# Patient Record
Sex: Female | Born: 1980 | Hispanic: Yes | Marital: Single | Smoking: Never smoker
Health system: Southern US, Community
[De-identification: ages and names within clinical notes are randomized; demographics above are authoritative.]

## PROBLEM LIST (undated history)

## (undated) DIAGNOSIS — E119 Type 2 diabetes mellitus without complications: Secondary | ICD-10-CM

---

## 2017-10-14 ENCOUNTER — Other Ambulatory Visit: Payer: Self-pay

## 2017-10-14 ENCOUNTER — Observation Stay (HOSPITAL_COMMUNITY)
Admission: EM | Admit: 2017-10-14 | Discharge: 2017-10-16 | Disposition: A | Discharge status: Home / Self Care | Payer: Self-pay | Attending: Family Medicine | Admitting: Family Medicine

## 2017-10-14 ENCOUNTER — Encounter (HOSPITAL_COMMUNITY): Payer: Self-pay | Admitting: Emergency Medicine

## 2017-10-14 DIAGNOSIS — E785 Hyperlipidemia, unspecified: Secondary | ICD-10-CM | POA: Insufficient documentation

## 2017-10-14 DIAGNOSIS — Z79899 Other long term (current) drug therapy: Secondary | ICD-10-CM | POA: Insufficient documentation

## 2017-10-14 DIAGNOSIS — E861 Hypovolemia: Secondary | ICD-10-CM

## 2017-10-14 DIAGNOSIS — E860 Dehydration: Secondary | ICD-10-CM | POA: Insufficient documentation

## 2017-10-14 DIAGNOSIS — R739 Hyperglycemia, unspecified: Secondary | ICD-10-CM

## 2017-10-14 DIAGNOSIS — I9589 Other hypotension: Secondary | ICD-10-CM

## 2017-10-14 DIAGNOSIS — R6510 Systemic inflammatory response syndrome (SIRS) of non-infectious origin without acute organ dysfunction: Secondary | ICD-10-CM

## 2017-10-14 DIAGNOSIS — R112 Nausea with vomiting, unspecified: Secondary | ICD-10-CM

## 2017-10-14 DIAGNOSIS — R079 Chest pain, unspecified: Secondary | ICD-10-CM

## 2017-10-14 DIAGNOSIS — E1165 Type 2 diabetes mellitus with hyperglycemia: Principal | ICD-10-CM | POA: Insufficient documentation

## 2017-10-14 DIAGNOSIS — R197 Diarrhea, unspecified: Secondary | ICD-10-CM

## 2017-10-14 DIAGNOSIS — Z7984 Long term (current) use of oral hypoglycemic drugs: Secondary | ICD-10-CM | POA: Insufficient documentation

## 2017-10-14 DIAGNOSIS — R000 Tachycardia, unspecified: Secondary | ICD-10-CM | POA: Insufficient documentation

## 2017-10-14 DIAGNOSIS — E119 Type 2 diabetes mellitus without complications: Secondary | ICD-10-CM

## 2017-10-14 HISTORY — DX: Type 2 diabetes mellitus without complications: E11.9

## 2017-10-14 LAB — COMPREHENSIVE METABOLIC PANEL
ALK PHOS: 100 U/L (ref 38–126)
ALT: 39 U/L (ref 0–44)
AST: 40 U/L (ref 15–41)
Albumin: 3.5 g/dL (ref 3.5–5.0)
Anion gap: 12 (ref 5–15)
BILIRUBIN TOTAL: 0.9 mg/dL (ref 0.3–1.2)
BUN: 30 mg/dL — ABNORMAL HIGH (ref 6–20)
CALCIUM: 8.9 mg/dL (ref 8.9–10.3)
CO2: 19 mmol/L — ABNORMAL LOW (ref 22–32)
Chloride: 100 mmol/L (ref 98–111)
Creatinine, Ser: 1.18 mg/dL — ABNORMAL HIGH (ref 0.44–1.00)
GFR, EST NON AFRICAN AMERICAN: 57 mL/min — AB (ref >60)
GLUCOSE: 526 mg/dL — AB (ref 70–99)
Potassium: 4.4 mmol/L (ref 3.5–5.1)
Sodium: 131 mmol/L — ABNORMAL LOW (ref 135–145)
TOTAL PROTEIN: 7.2 g/dL (ref 6.5–8.1)

## 2017-10-14 LAB — BASIC METABOLIC PANEL
Anion gap: 9 (ref 5–15)
Anion gap: 9 (ref 5–15)
BUN: 19 mg/dL (ref 6–20)
BUN: 17 mg/dL (ref 6–20)
CALCIUM: 7.9 mg/dL — AB (ref 8.9–10.3)
CALCIUM: 8.1 mg/dL — AB (ref 8.9–10.3)
CO2: 19 mmol/L — ABNORMAL LOW (ref 22–32)
CO2: 18 mmol/L — ABNORMAL LOW (ref 22–32)
CREATININE: 0.93 mg/dL (ref 0.44–1.00)
CREATININE: 0.91 mg/dL (ref 0.44–1.00)
Chloride: 107 mmol/L (ref 98–111)
Chloride: 105 mmol/L (ref 98–111)
GFR calc Af Amer: >60 mL/min (ref >60)
GFR calc Af Amer: >60 mL/min (ref >60)
GLUCOSE: 338 mg/dL — AB (ref 70–99)
GLUCOSE: 289 mg/dL — AB (ref 70–99)
Potassium: 3.6 mmol/L (ref 3.5–5.1)
Potassium: 3.5 mmol/L (ref 3.5–5.1)
Sodium: 135 mmol/L (ref 135–145)
Sodium: 132 mmol/L — ABNORMAL LOW (ref 135–145)

## 2017-10-14 LAB — CBC
HCT: 40.8 % (ref 36.0–46.0)
HEMOGLOBIN: 13.1 g/dL (ref 12.0–15.0)
MCH: 28.1 pg (ref 26.0–34.0)
MCHC: 32.1 g/dL (ref 30.0–36.0)
MCV: 87.4 fL (ref 78.0–100.0)
Platelets: 313 10*3/uL (ref 150–400)
RBC: 4.67 MIL/uL (ref 3.87–5.11)
RDW: 13.1 % (ref 11.5–15.5)
WBC: 12.6 10*3/uL — AB (ref 4.0–10.5)

## 2017-10-14 LAB — LIPID PANEL
Cholesterol: 235 mg/dL — ABNORMAL HIGH (ref 0–200)
HDL: 27 mg/dL — ABNORMAL LOW (ref >40)
LDL CALC: 132 mg/dL — AB (ref 0–99)
Total CHOL/HDL Ratio: 8.7 RATIO
Triglycerides: 380 mg/dL — ABNORMAL HIGH (ref <150)
VLDL: 76 mg/dL — ABNORMAL HIGH (ref 0–40)

## 2017-10-14 LAB — TROPONIN I: Troponin I: <0.03 ng/mL (ref <0.03)

## 2017-10-14 LAB — TSH: TSH: 0.555 u[IU]/mL (ref 0.350–4.500)

## 2017-10-14 LAB — LIPASE, BLOOD: Lipase: 42 U/L (ref 11–51)

## 2017-10-14 LAB — GLUCOSE, CAPILLARY
GLUCOSE-CAPILLARY: 287 mg/dL — AB (ref 70–99)
GLUCOSE-CAPILLARY: 258 mg/dL — AB (ref 70–99)

## 2017-10-14 LAB — I-STAT BETA HCG BLOOD, ED (MC, WL, AP ONLY)

## 2017-10-14 LAB — ALBUMIN: Albumin: 2.9 g/dL — ABNORMAL LOW (ref 3.5–5.0)

## 2017-10-14 LAB — HEMOGLOBIN A1C
HEMOGLOBIN A1C: 12.9 % — AB (ref 4.8–5.6)
MEAN PLASMA GLUCOSE: 323.53 mg/dL

## 2017-10-14 LAB — BRAIN NATRIURETIC PEPTIDE: B NATRIURETIC PEPTIDE 5: 58 pg/mL (ref 0.0–100.0)

## 2017-10-14 LAB — CBG MONITORING, ED: Glucose-Capillary: 340 mg/dL — ABNORMAL HIGH (ref 70–99)

## 2017-10-14 LAB — D-DIMER, QUANTITATIVE: D-Dimer, Quant: 0.34 ug/mL-FEU (ref 0.00–0.50)

## 2017-10-14 MED ORDER — SODIUM CHLORIDE 0.9 % IV SOLN
INTRAVENOUS | Status: AC
  Administered 2017-10-14 (×2): via INTRAVENOUS

## 2017-10-14 MED ORDER — INSULIN ASPART 100 UNIT/ML ~~LOC~~ SOLN
0.0000 [IU] | Freq: Three times a day (TID) | SUBCUTANEOUS | Status: DC
  Administered 2017-10-14: 5 [IU] via SUBCUTANEOUS
  Administered 2017-10-15 – 2017-10-16 (×4): 3 [IU] via SUBCUTANEOUS
  Administered 2017-10-16: 5 [IU] via SUBCUTANEOUS

## 2017-10-14 MED ORDER — INSULIN ASPART 100 UNIT/ML ~~LOC~~ SOLN
0.0000 [IU] | Freq: Every day | SUBCUTANEOUS | Status: DC
  Administered 2017-10-14: 3 [IU] via SUBCUTANEOUS
  Administered 2017-10-15: 2 [IU] via SUBCUTANEOUS

## 2017-10-14 MED ORDER — SODIUM CHLORIDE 0.9 % IV BOLUS
1000.0000 mL | Freq: Once | INTRAVENOUS | Status: AC
Start: 2017-10-14 — End: 2017-10-14
  Administered 2017-10-14: 1000 mL via INTRAVENOUS

## 2017-10-14 MED ORDER — ONDANSETRON 4 MG PO TBDP
4.0000 mg | ORAL_TABLET | Freq: Once | ORAL | Status: AC | PRN
  Administered 2017-10-14: 4 mg via ORAL
  Filled 2017-10-14: qty 1

## 2017-10-14 MED ORDER — ENOXAPARIN SODIUM 40 MG/0.4ML ~~LOC~~ SOLN
40.0000 mg | SUBCUTANEOUS | Status: DC
  Administered 2017-10-14 – 2017-10-15 (×2): 40 mg via SUBCUTANEOUS
  Filled 2017-10-14 (×2): qty 0.4

## 2017-10-14 MED ORDER — ONDANSETRON HCL 4 MG/2ML IJ SOLN
4.0000 mg | Freq: Four times a day (QID) | INTRAMUSCULAR | Status: DC | PRN
Start: 2017-10-14 — End: 2017-10-16

## 2017-10-14 MED ORDER — ACETAMINOPHEN 325 MG PO TABS
650.0000 mg | ORAL_TABLET | ORAL | Status: DC | PRN
  Administered 2017-10-14: 650 mg via ORAL
  Filled 2017-10-14: qty 2

## 2017-10-14 MED ORDER — ACETAMINOPHEN 325 MG PO TABS: 650.0000 mg | ORAL_TABLET | ORAL | Status: DC | PRN

## 2017-10-14 MED ORDER — SODIUM CHLORIDE 0.9 % IV BOLUS
2000.0000 mL | Freq: Once | INTRAVENOUS | Status: AC
Start: 2017-10-14 — End: 2017-10-14
  Administered 2017-10-14: 2000 mL via INTRAVENOUS

## 2017-10-14 MED ORDER — CALCIUM CARBONATE ANTACID 500 MG PO CHEW
2.0000 | CHEWABLE_TABLET | Freq: Every day | ORAL | Status: DC
  Administered 2017-10-14 – 2017-10-16 (×3): 400 mg via ORAL
  Filled 2017-10-14 (×3): qty 2

## 2017-10-14 MED ORDER — GI COCKTAIL ~~LOC~~: 30.0000 mL | Freq: Four times a day (QID) | ORAL | Status: DC | PRN

## 2017-10-14 NOTE — Progress Notes (Addendum)
Family Medicine Teaching Service Daily Progress Note Intern Pager: [phone]  Patient name: Alexis Kirby Medical record number: [id number] Date of birth: [birth year] Age: 37 y.o. Gender: female  Primary Care Provider: Patient, No Pcp Per Consultants: None Code Status: Full code  Pt Overview and Major Events to Date:  7/7 Admitted with hyperglycemia, dehydration, N/V/D  Assessment and Plan:  Alexis CohenJulie Alcorta Kirby is a 37 y.o. female presenting with hyperglycemia, nausea, vomiting, diarrhea. PMH is significant for T2DM uncontrolled.  T2DM  Hyperglycemia >500, not DKA: Uncontrolled.  Patient does not have insurance.  States that she has been taking a friend's metformin 800mg  QD.  She has been on insulin in the past, but has been without it for many years.  She states that she will occasionally check her sugars.  Last week was in 300s.  She states that she has not been taking metformin for the last few days.  At 10pm on 7/6 patient started to suddenly experience cramping abdominal pain, nausea, vomiting, and diarrhea.  She notes that she was at a party and had eaten two hours prior.  It continued throughout the night into this AM, when she started to also experience left sided chest pain and shortness of breath. Patient has received 5L NS since admission.  She had temp on 7/7 at 2053 of 101.9, was given tylenol, and patient is afebrile this AM, pulse improved to 105 this AM from admission in 140s.  Hgb A1c 12.9, BS 218 this AM.  Cr 0.84 on 7/8.  Suspect Viral GI component.  Patient notes that she is feeling much better this AM, still having diarrhea, but has decreased.  Denies N/V. - monitor vitals - sSSI and monitor CBGs AC/HS - Zofran for nausea - Will continue to hydrate, mIVF @ 16400mL/hr x 10 hrs - Order C-peptide, A1c - f/u BMP Q4, last today at 1139, then monitor daily - UA pending - repeat CBC in AM  Chest pain (potentially exertional angina): Patient notes stabbing chest pain off an  on for the past few months.  She states that it has worsened with episodes of vomiting.  She notes that it is left sided and worsens with activity.  Radiates from the stomach when cramping and occasionally "my left arm gets tight."  She denies prior cardiac history or hx HTN but admittedly has a very incomplete hx of medical interaction  She does have some reporducible chest pain but states the Chest pain she was talking about is not the one reproducible to palpation of chest wall. D-dimer in ED was negative.  Patient denies current chest pain.  BNP 58, Lipid Panel, TSH 0.555, LDL 132, TG 380.  TG could be elevated in hyperglycemia. Troponin negative x2, no draw last PM for third - Heart score of 2 - incomplete information - f/u echo - Start Simvastatin 20mg  QD, on [employer] $4 List - order CXR  Hyponatremia: Na 131, S/p 3L NS in ED, corrected NA 138.  Na on 7/8 138.  - mIVF @ 100cc/hr x 10 hrs - BMP QD  Needs Outpatient management : Patient does not have a PCP and does not have insurance.  She has been without medicaitons for years for this reason.  She lives in [place], [place] and is here visiting family. - f/u Social Work for assistance with outpatient management, suggest trying FM residency at [facility] in [place] - has had insurance in the past, lost since lost job - will consult care management for assistance with  Medicaid  History of Bartholin Cyst: has history of bartholin cyst that ruptured, required surgery, and caused patient to become septic.  She states that over the last week or so she has started to feel like she is getting another one.  Today she states that she feels as though her cyst has improved and would like to defer exam at this time - consider exam as needed  FEN/GI: Carb modified Prophylaxis: Lovenox    Disposition: home  Subjective:  Patient states that she is feeling much better this AM.  Denies CP, SOB.  Admits to continued diarrhea without blood, but  states it is less frequent.  Denies N/V.    Objective: Temp:  [98.5 F (36.9 C)-101.9 F (38.8 C)] 98.5 F (36.9 C) (07/08 0449) Pulse Rate:  [105-140] 105 (07/08 0449) Resp:  [15-24] 16 (07/08 0449) BP: (96-131)/(48-96) 131/85 (07/08 0449) SpO2:  [93 %-100 %] 100 % (07/08 0449) Physical Exam: General: 37 yo female lying in bed in NAD Cardiovascular: Mildly tachycardic, no m/r/g Respiratory: CTAB, no wheezes, crackles  Abdomen: Soft, non-tender, + bowel sounds Extremities: Trace edema, BLE  Laboratory: Recent Labs  Lab 10/14/17 0420  WBC 12.6*  HGB 13.1  HCT 40.8  PLT 313   Recent Labs  Lab 10/14/17 0420  10/14/17 1923 10/15/17 0331 10/15/17 0709  NA 131*   < > 132* 136 138  K 4.4   < > 3.5 3.7 4.1  CL 100   < > 105 108 109  CO2 19*   < > 18* 21* 25  BUN 30*   < > 17 14 11   CREATININE 1.18*   < > 0.91 0.84 0.78  CALCIUM 8.9   < > 8.1* 7.9* 7.8*  PROT 7.2  --   --   --   --   BILITOT 0.9  --   --   --   --   ALKPHOS 100  --   --   --   --   ALT 39  --   --   --   --   AST 40  --   --   --   --   GLUCOSE 526*   < > 289* 218* 259*   < > = values in this interval not displayed.       Imaging/Diagnostic Tests: No results found.  Rittberger, Solmon Ice, DO 10/15/2017, 9:09 AM PGY-1, [place] FPTS Intern pager: [phone], text pages welcome

## 2017-10-14 NOTE — ED Provider Notes (Signed)
[facility] EMERGENCY [facility] Provider Note   CSN: [id number] Arrival date & time: 10/14/17  0348     History   Chief Complaint Chief Complaint  Patient presents with  . Emesis    HPI Alexis BoerJulie Kirby Alexis Kirby is a 37 y.o. female.  Patient with history of diabetes presents with complaint of N/V/D since 11pm.  She reports some preceding abd 'discomfort' that occurred while she was at a party.  She had multiple episodes of nonbloody, nonbilious emesis at home and watery, nonbloody diarrhea.  Symptoms persisted early this morning.  She decided to come in b/c of sharp pains in chest and some shortness of breath.  Patient denies any fever, cough.  No urinary symptoms.  No skin rashes or leg swelling.  She reports being off Glucophage for about 2 months because she lost insurance.  She has no known sick contacts and reports that nobody else that she knows of, including those at the party, has similar symptoms.  Patient denies risk factors for pulmonary embolism including: unilateral leg swelling, history of DVT/PE/other blood clots, use of exogenous hormones, recent immobilizations, recent surgery, recent travel (>4hr segment), malignancy, hemoptysis.        Past Medical History:  Diagnosis Date  . Diabetes mellitus without complication (HCC)     There are no active problems to display for this patient.   History reviewed. No pertinent surgical history.   OB History   None      Home Medications    Prior to Admission medications   Not on File    Family History No family history on file.  Social History Social History   Tobacco Use  . Smoking status: Never Smoker  . Smokeless tobacco: Never Used  Substance Use Topics  . Alcohol use: Never    Frequency: Never  . Drug use: Never     Allergies   Patient has no known allergies.   Review of Systems Review of Systems  Constitutional: Negative for appetite change and fever.  HENT: Negative for  rhinorrhea and sore throat.   Eyes: Negative for redness.  Respiratory: Positive for shortness of breath. Negative for cough.   Cardiovascular: Positive for chest pain and leg swelling.  Gastrointestinal: Positive for diarrhea, nausea and vomiting. Negative for abdominal pain and blood in stool.       Negative for hematemesis  Genitourinary: Negative for dysuria.  Musculoskeletal: Negative for myalgias.  Skin: Negative for rash.  Neurological: Negative for light-headedness.     Physical Exam Updated Vital Signs BP (!) 152/101   Pulse (!) 137   Temp 97.8 F (36.6 C) (Oral)   Resp 18   Ht 5\' 5"  (1.651 m)   Wt 106.1 kg (234 lb)   LMP 10/08/2017   SpO2 100%   BMI 38.94 kg/m   Physical Exam  Constitutional: She appears well-developed and well-nourished.  HENT:  Head: Normocephalic and atraumatic.  Eyes: Conjunctivae are normal. Right eye exhibits no discharge. Left eye exhibits no discharge.  Neck: Normal range of motion. Neck supple.  Cardiovascular: Regular rhythm and normal heart sounds. Tachycardia present.  Pulmonary/Chest: Effort normal and breath sounds normal. No respiratory distress. She has no wheezes. She has no rales.  Abdominal: Soft. She exhibits no mass. There is tenderness (generalized but more severe over left abdomen). There is no rebound and no guarding.  Neurological: She is alert.  Skin: Skin is warm and dry.  Psychiatric: She has a normal mood and affect.  Nursing  note and vitals reviewed.    ED Treatments / Results  Labs (all labs ordered are listed, but only abnormal results are displayed) Labs Reviewed  COMPREHENSIVE METABOLIC PANEL - Abnormal; Notable for the following components:      Result Value   Sodium 131 (*)    CO2 19 (*)    Glucose, Bld 526 (*)    BUN 30 (*)    Creatinine, Ser 1.18 (*)    GFR calc non Af Amer 57 (*)    All other components within normal limits  CBC - Abnormal; Notable for the following components:   WBC 12.6 (*)      All other components within normal limits  CBG MONITORING, ED - Abnormal; Notable for the following components:   Glucose-Capillary 340 (*)    All other components within normal limits  LIPASE, BLOOD  D-DIMER, QUANTITATIVE (NOT AT [facility])  URINALYSIS, ROUTINE W REFLEX MICROSCOPIC  I-STAT BETA HCG BLOOD, ED ([facility], [facility], [facility] ONLY)  CBG MONITORING, ED    ED ECG REPORT   Date: 10/14/2017  Rate: 125  Rhythm: sinus tachycardia  QRS Axis: right  Intervals: normal  ST/T Wave abnormalities: normal  Conduction Disutrbances:none  Narrative Interpretation:   Old EKG Reviewed: none available  I have personally reviewed the EKG tracing and agree with the computerized printout as noted.  Radiology No results found.  Procedures Procedures (including critical care time)  Medications Ordered in ED Medications  sodium chloride 0.9 % bolus 1,000 mL (1,000 mLs Intravenous New Bag/Given 10/14/17 1411)  ondansetron (ZOFRAN-ODT) disintegrating tablet 4 mg (4 mg Oral Given 10/14/17 0437)  sodium chloride 0.9 % bolus 2,000 mL (0 mLs Intravenous Stopped 10/14/17 0718)     Initial Impression / Assessment and Plan / ED Course  I have reviewed the triage vital signs and the nursing notes.  Pertinent labs & imaging results that were available during my care of the patient were reviewed by me and considered in my medical decision making (see chart for details).     Patient seen and examined. Work-up initiated. Medications ordered.   Vital signs reviewed and are as follows: BP (!) 152/101   Pulse (!) 137   Temp 97.8 F (36.6 C) (Oral)   Resp 18   Ht 5\' 5"  (1.651 m)   Wt 106.1 kg (234 lb)   LMP 10/08/2017   SpO2 100%   BMI 38.94 kg/m   Fluids ordered. Abd is generally tender but worse on left. Labs reassuring to this point. No active CP or SOB. Will hydrate and reassess HR.   8:17 AM patient continues to have frequent episodes of diarrhea.  She has been hydrated 2 L normal saline and continues to  have persistently elevated heart rate 130-140.  Patient is not short of breath.  She has some intermittent aching pains in her left chest.  She also reports swelling of her feet and ankles.  We discussed unlikely possibility of blood clot in the lungs given that the vomiting is the more likely cause, however she would like to be screened for this.  We discussed d-dimer testing and need for CT if this is positive.  Patient wishes to proceed.  This was ordered.  12:44 PM D-dimer ordered several hours ago could not be found by lab. This was re-drawn.   2:34 PM d-dimer was negative.  Hydration continued.  Patient is very orthostatic with standing with blood pressures in the 70s systolic.  Patient agrees to be admitted for continued  hydration and symptom management.  Orthostatic VS for the past 24 hrs:  BP- Lying Pulse- Lying BP- Sitting Pulse- Sitting BP- Standing at 0 minutes Pulse- Standing at 0 minutes  10/14/17 1414 122/78 132 (!) 88/70 138 (!) 76/50 140   Spoke with Family Medicine who will see patient.   Final Clinical Impressions(s) / ED Diagnoses   Final diagnoses:  Dehydration  Nausea vomiting and diarrhea  Hyperglycemia without ketosis  Hypotension due to hypovolemia   Admit.   ED Discharge Orders    None       Renne Crigler, Cordelia Poche 10/14/17 1435    Glynn Octave, MD 10/14/17 [id number]

## 2017-10-14 NOTE — Progress Notes (Signed)
Received page that patient's pulse 130-136, patient asymptomatic.  Labs reviewed, troponin negative, BNP 55.  Continue to give IV fluids.  On telemetry.    Alexis IceBailey J Rittberger, DO 5:46 PM

## 2017-10-14 NOTE — H&P (Addendum)
Family Medicine Teaching [facility] Admission History and Physical Service Pager: [phone]  Patient name: Alexis Kirby Medical record number: [id number] Date of birth: [birth year] Age: 37 y.o. Gender: female  Primary Care Provider: Patient, No Pcp Per Consultants: None Code Status: Full Code  Chief Complaint: Hyperglycemia with dehydration  Assessment and Plan: Alexis Kirby is a 37 y.o. female presenting with hyperglycemia, nausea, vomiting, diarrhea. PMH is significant for T2DM uncontrolled.  T2DM  Hyperglycemia >500, not DKA: Uncontrolled.  Patient does not have insurance.  States that she has been taking a friend's metformin 800mg  QD.  She has been on insulin in the past, but has been without it for many years.  She states that she will occasionally check her sugars.  Last week was in 300s.  She states that she has not been taking metformin for the last few days.  At 10pm on 7/6 patient started to suddenly experience cramping abdominal pain, nausea, vomiting, and diarrhea.  She notes that she was at a party and had eaten two hours prior.  It continued throughout the night into this AM, when she started to also experience left sided chest pain and shortness of breath. Patient has received 3L in ED.  Continues to be hypotensive, tachycardic, and mildly tachypnic, but resting comfortably in bed.  She has remained afebrile.  Oral mucosa dry.   - Admit to telemetry, attending Dr. Pollie Meyer - Diet: carb modified - Condition: Stable, but tachy and tachypnic - monitor vitals - out of bed with assistance as patient is orthostatic - start sSSI and monitor CBGs AC/HS - Zofran for nausea - IVF NS @ 200cc/hr x 10hrs - Order C-peptide, A1c - order BMP Q4  - UA pending - repeat CBC in AM  Chest pain (potentially exertional angina): Patient notes stabbing chest pain off an on for the past few months.  She states that it has worsened with episodes of vomiting.  She notes that it is left  sided and worsens with activity.  Radiates from the stomach when cramping and occasionally "my left arm gets tight."  She denies prior cardiac history or hx HTN but admittedly has a very incomplete hx of medical interaction  She does have some reporducible chest pain but states the Chest pain she was talking about is not the one reproducible to palpation of chest wall. D-dimer in ED was negative.  Patient denies current chest pain.  - Heart score of 2 - incomplete information - order BNP, Lipid Panel, TSH - echo - troponins x3  Hyponatremia: Na 131, S/p 3L NS in ED - start NS @ 200cc/hr x 10 hrs - BMP Q4  Needs Outpatient management : Patient does not have a PCP and does not have insurance.  She has been without medicaitons for years for this reason.  She lives in [place], [place] and is here visiting family. - f/u Social Work for assistance with outpatient management, suggest trying FM residency at [facility] in [place]  History of Bartholin Cyst: has history of bartholin cyst that ruptured, required surgery, and caused patient to become septic.  She states that over the last week or so she has started to feel like she is getting another one. - exam in AM - unable to exam in ED due to patient's family being in the room  FEN/GI: Carb modified Prophylaxis: Lovenox  Disposition: home  History of Present Illness:  Alexis Kirby is a 37 y.o. female presenting with 24hrs of vomiting and diarrhea  that started suddenly.  She states that the symptoms began after eating at a party two hours prior.  She denies fevers, admits to some chills.  She denies knowing of anyone else with similar symptoms at the party.    She also mentions chest pain with tightness in left arm since this started.  She states that the chest pain worsens with abdominal cramping.  Has been feeling lightheaded when standing.  Has had occasional chest pains for years but no medical followup for it.  T2DM diagnosed at 13.   On metformin currently (800?, getting pills from a friend), was on insulin prior but has been off of it for years.  Hasn't checked cbgs for ~1wk (300).  Lives in [place] and is here visiting family.  Does not have a doctor in [place].  Just off of a heavy period, was on ibuprofen for pain.  No thirst complaints, no urinary complaints, , no weight changes, no vaginal complaints.  She denies any previous hospitalizations for diabetes.  Hx of bartholin cyst that ruptured and she went septic.  Notes that she "feels like I'm starting to get another one."   Review Of Systems: Per HPI in addition, no hematochezia, melena, hematemesis.   Patient Active Problem List   Diagnosis Date Noted  . Hyperglycemia 10/14/2017  . T2DM (type 2 diabetes mellitus) (HCC) 10/14/2017  . Chest pain 10/14/2017    Past Medical History: Past Medical History:  Diagnosis Date  . Diabetes mellitus without complication [facility])     Past Surgical History: History reviewed. No pertinent surgical history.  Social History: Social History   Tobacco Use  . Smoking status: Never Smoker  . Smokeless tobacco: Never Used  Substance Use Topics  . Alcohol use: Never    Frequency: Never  . Drug use: Never    Please also refer to relevant sections of EMR.  Family History: Mother has history of DM  Allergies and Medications: No Known Allergies No current facility-administered medications on file prior to encounter.    No current outpatient medications on file prior to encounter.    Objective: BP 125/89 (BP Location: Right Arm)   Pulse (!) 127   Temp 100 F (37.8 C) (Oral)   Resp 20   Ht 5\' 5"  (1.651 m)   Wt 234 lb (106.1 kg)   LMP 10/08/2017   SpO2 99%   BMI 38.94 kg/m   Physical Exam  Constitutional: No distress.  HENT:  Head: Normocephalic and atraumatic.  Oropharynx mildly dry  Eyes: Pupils are equal, round, and reactive to light.  Cardiovascular: Regular rhythm. Exam reveals no gallop and no  friction rub.  No murmur heard. Tachycardic  Respiratory: No respiratory distress. She has no wheezes. She has no rales. She exhibits no tenderness.  Mildly Tachypnic  GI: Soft. Bowel sounds are normal. She exhibits no distension. There is tenderness (LLQ). There is no guarding.  Musculoskeletal: She exhibits edema (Trace BLE).  Neurological: She is alert. No cranial nerve deficit.  Oriented for conversation   Skin: Skin is warm and dry. She is not diaphoretic.  Psychiatric: She has a normal mood and affect.    Labs and Imaging: CBC BMET  Recent Labs  Lab 10/14/17 0420  WBC 12.6*  HGB 13.1  HCT 40.8  PLT 313   Recent Labs  Lab 10/14/17 1623  NA 135  K 3.6  CL 107  CO2 19*  BUN 19  CREATININE 0.93  GLUCOSE 338*  CALCIUM 7.9*  No results found.   Marthenia RollingBland, Geanette Buonocore, DO 10/14/2017, 6:14 PM PGY-1, [place] [place] [facility] FPTS Intern pager: [phone], text pages welcome  FPTS Upper-Level Resident Addendum   I have independently interviewed and examined the patient. I have discussed the above with the original author and agree with their documentation. My edits for correction/addition/clarification are in blue. Please see also any attending notes.    Marthenia RollingScott Jobina Maita, DO PGY-2, [place] 10/14/2017 6:14 PM  FPTS Service pager: [phone] (text pages welcome through [facility])

## 2017-10-14 NOTE — ED Triage Notes (Signed)
Pt reports constant emesis and diarrhea since 2300 last night, pt states pain now beginning to radiate up from abdomen into chest area.

## 2017-10-15 ENCOUNTER — Encounter (HOSPITAL_COMMUNITY): Payer: Self-pay | Admitting: Physician Assistant

## 2017-10-15 ENCOUNTER — Observation Stay (HOSPITAL_BASED_OUTPATIENT_CLINIC_OR_DEPARTMENT_OTHER): Payer: Self-pay

## 2017-10-15 ENCOUNTER — Observation Stay (HOSPITAL_COMMUNITY): Payer: Self-pay

## 2017-10-15 DIAGNOSIS — E782 Mixed hyperlipidemia: Secondary | ICD-10-CM

## 2017-10-15 DIAGNOSIS — R739 Hyperglycemia, unspecified: Secondary | ICD-10-CM

## 2017-10-15 DIAGNOSIS — I9589 Other hypotension: Secondary | ICD-10-CM

## 2017-10-15 DIAGNOSIS — E118 Type 2 diabetes mellitus with unspecified complications: Secondary | ICD-10-CM

## 2017-10-15 DIAGNOSIS — E861 Hypovolemia: Secondary | ICD-10-CM

## 2017-10-15 DIAGNOSIS — R079 Chest pain, unspecified: Secondary | ICD-10-CM

## 2017-10-15 DIAGNOSIS — E860 Dehydration: Secondary | ICD-10-CM

## 2017-10-15 LAB — GLUCOSE, CAPILLARY
GLUCOSE-CAPILLARY: 230 mg/dL — AB (ref 70–99)
GLUCOSE-CAPILLARY: 208 mg/dL — AB (ref 70–99)
Glucose-Capillary: 235 mg/dL — ABNORMAL HIGH (ref 70–99)
Glucose-Capillary: 214 mg/dL — ABNORMAL HIGH (ref 70–99)

## 2017-10-15 LAB — BASIC METABOLIC PANEL
ANION GAP: 7 (ref 5–15)
Anion gap: 4 — ABNORMAL LOW (ref 5–15)
BUN: 14 mg/dL (ref 6–20)
BUN: 11 mg/dL (ref 6–20)
CALCIUM: 7.8 mg/dL — AB (ref 8.9–10.3)
CHLORIDE: 108 mmol/L (ref 98–111)
CHLORIDE: 109 mmol/L (ref 98–111)
CO2: 21 mmol/L — AB (ref 22–32)
CO2: 25 mmol/L (ref 22–32)
CREATININE: 0.78 mg/dL (ref 0.44–1.00)
Calcium: 7.9 mg/dL — ABNORMAL LOW (ref 8.9–10.3)
Creatinine, Ser: 0.84 mg/dL (ref 0.44–1.00)
GFR calc non Af Amer: >60 mL/min (ref >60)
GFR calc non Af Amer: >60 mL/min (ref >60)
GLUCOSE: 259 mg/dL — AB (ref 70–99)
Glucose, Bld: 218 mg/dL — ABNORMAL HIGH (ref 70–99)
POTASSIUM: 3.7 mmol/L (ref 3.5–5.1)
Potassium: 4.1 mmol/L (ref 3.5–5.1)
Sodium: 136 mmol/L (ref 135–145)
Sodium: 138 mmol/L (ref 135–145)

## 2017-10-15 LAB — ECHOCARDIOGRAM COMPLETE
HEIGHTINCHES: 65 in
WEIGHTICAEL: 3744 [oz_av]

## 2017-10-15 LAB — HIV ANTIBODY (ROUTINE TESTING W REFLEX): HIV Screen 4th Generation wRfx: NONREACTIVE

## 2017-10-15 LAB — C-PEPTIDE: C-Peptide: 4.2 ng/mL (ref 1.1–4.4)

## 2017-10-15 MED ORDER — SODIUM CHLORIDE 0.9 % IV SOLN
INTRAVENOUS | Status: AC
  Administered 2017-10-15: 11:00:00 via INTRAVENOUS

## 2017-10-15 MED ORDER — INSULIN ASPART PROT & ASPART (70-30 MIX) 100 UNIT/ML ~~LOC~~ SUSP
3.0000 [IU] | Freq: Every day | SUBCUTANEOUS | Status: DC
Start: 2017-10-15 — End: 2017-10-16
  Administered 2017-10-15: 3 [IU] via SUBCUTANEOUS
  Filled 2017-10-15: qty 10

## 2017-10-15 MED ORDER — SIMVASTATIN 20 MG PO TABS
20.0000 mg | ORAL_TABLET | Freq: Every day | ORAL | Status: DC
  Administered 2017-10-15: 20 mg via ORAL
  Filled 2017-10-15: qty 1

## 2017-10-15 MED ORDER — INSULIN STARTER KIT- SYRINGES (ENGLISH)
1.0000 | Freq: Once | Status: AC
  Administered 2017-10-15: 1
  Filled 2017-10-15: qty 1

## 2017-10-15 MED ORDER — LIVING WELL WITH DIABETES BOOK
Freq: Once | Status: AC
  Administered 2017-10-15: 19:00:00
  Filled 2017-10-15: qty 1

## 2017-10-15 NOTE — Care Management Note (Signed)
Case Management Note  Patient Details  Name: Lamount CohenJulie Alcorta Lookingbill MRN: [id number] Date of Birth: [birth year]  Subjective/Objective:                    Action/Plan:  [place] . No insurance .   Per notes MD wanted NCM to arrange [facility] up [facility] [phone]. Called same advised to refer [facility] [phone].  Called same patient has to call herself to arrange appointment. Information given to patient.   MATCH letter given to patient and explained. Attending will have to write prescriptions.   Patient and visitor voiced understanding to all of above.  Expected Discharge Date:  10/16/17               Expected Discharge Plan:  Home/Self Care  In-House Referral:  Financial Counselor, PCP / [facility]  Discharge planning Services  CM Consult, Medication Assistance, MATCH Program  Post Acute Care Choice:  NA Choice offered to:  Patient  DME Arranged:  N/A DME Agency:  NA  HH Arranged:  NA HH Agency:  NA  Status of Service:  Completed, signed off  If discussed at Long Length of Stay Meetings, dates discussed:    Additional Comments:  Kingsley PlanWile, Latangela Mccomas Marie, RN 10/15/2017, 12:00 PM

## 2017-10-15 NOTE — Progress Notes (Addendum)
Inpatient Diabetes Program Recommendations  AACE/ADA: New Consensus Statement on Inpatient Glycemic Control (2015)  Target Ranges:  Prepandial:   less than 140 mg/dL      Peak postprandial:   less than 180 mg/dL (1-2 hours)      Critically ill patients:  140 - 180 mg/dL   Lab Results  Component Value Date   GLUCAP 235 (H) 10/15/2017   HGBA1C 12.9 (H) 10/14/2017   Review of Glycemic Control  Diabetes history: DM 2 Outpatient Diabetes medications: None Current orders for Inpatient glycemic control: Novolog 0-9 units tid, Novolog 0-5 units qhs  Inpatient Diabetes Program Recommendations:   A1c 12.9% meeting criteria for dual therapy with an oral agent and insulin. Patient does not have insurance, however Metformin along with Sulfonylureas are $4 medications. Will need CM to see about f/u. Will give patient [employer] price list for future and back up plan. Would recommend 70/30 at time of d/c if unable to get medications through clinic.  Please start basal insulin while here, Lantus 18 units Q24 hours. Can dose 70/30 from Lantus dose if patient can tolerate. [facility] has both vial and insulin pen options available.  Will see patient later this am.  Thanks,  Christena DeemShannon Sabel Hornbeck RN, MSN, BC-ADM, [facility] Inpatient Diabetes Coordinator Team Pager [phone] (8a-5p)

## 2017-10-15 NOTE — Progress Notes (Signed)
Inpatient Diabetes Program Recommendations  AACE/ADA: New Consensus Statement on Inpatient Glycemic Control (2015)  Target Ranges:  Prepandial:   less than 140 mg/dL      Peak postprandial:   less than 180 mg/dL (1-2 hours)      Critically ill patients:  140 - 180 mg/dL   Diabetes history: New diagnosis  Spoke with patient at bedside. Patient reports being told she had diabetes at age 37 years old at a health screening but has never followed up since that time. This is a new diagnosis for patient. Discussed A1C results (12.9% this admission) and explained what an A1C is. Patient reports Diabetes runs in her family. Due to her family history patient checked her glucose and it was in the 300's. She has been taking a friends Metformin once a day without side effects.  Discussed basic pathophysiology of DM Type 2, basic home care, importance of checking CBGs and maintaining good CBG control to prevent long-term and short-term complications. Reviewed glucose and A1C goals. Reviewed signs and symptoms of hyperglycemia and hypoglycemia along with treatment for both.   Discussed impact of nutrition, exercise, stress, sickness, and medications on diabetes control. Discussed carbohydrates, carbohydrate goals per day and meal, along with portion sizes. Discussed cutting down portion sizes of tortillas, rice and beans, which is their family staple.   Reviewed Living Well with diabetes booklet and encouraged patient to read through entire book. Informed patient that she maybe prescribed insulin at time of discharge.  Informed patient that Novolin 70/30 can be [facility] for $25 per vial. Provided patient with handout information on Reli-On products and encouraged patient to go to [employer] to get the Reli-On Prime glucometer for $9 and a box of 50 Reli-On test strips for $9.  Asked patient to check her glucose at least 2 times per day (fasting and alternating second check).    Reviewed and  demonstrated how to draw up and administer insulin with vial and syringe and insulin pen. Patient prefers vial and syringe due to cost. Informed patient that RN will be asking him to self-administer insulin to ensure proper technique and ability to administer self insulin shots.  RNs to provide ongoing basic DM education at bedside with this patient and engage patient to actively check blood glucose and administer insulin injections.   Thanks, Christena DeemShannon Nazaria Riesen RN, MSN, BC-ADM, [facility] Inpatient Diabetes Coordinator Team Pager [phone] (8a-5p)

## 2017-10-15 NOTE — Consult Note (Addendum)
Cardiology Consultation:   Patient ID: Alexis Kirby; [id number]; Mar 22, 1981   Admit date: 10/14/2017 Date of Consult: 10/15/2017  Primary Care Provider: Patient, No Pcp Per Primary Cardiologist: new - Dr. Delton See Primary Electrophysiologist:  N/A  Patient Profile:   Alexis Kirby is a 37 y.o. female with a hx of uncontrolled DM II and HLD who is being seen today for the evaluation of chest pain after 4 hours of persistent chest pain at the request of Dr. Pollie Meyer.  History of Present Illness:   Alexis Kirby is 37 year old female with past medical history of uncontrolled diabetes and HLD.  Due to monitor reasons, she has not filled her Glucophage for the past 2 months.  She was at a party yesterday, 4 hours after the party she started having vomiting.  After several hours of vomiting, she started having abdominal discomfort radiating to the left chest.  This only occurs for a few seconds at a time.  The discomfort was intermittent, the last episode was around 1 AM this morning.  Due to intractable vomiting, she was admitted to family medicine service on 10/14/2017.  Initial work-up reviewed revealed blood glucose level 526, her creatinine was also mildly elevated at 1.18, BUN was elevated at 30.  She was tachycardic on first arrival.  She was treated for dehydration and hyperglycemia.  Pregnancy test was negative.  TSH normal.  Hemoglobin A1c was 12.9.  Lipid panel showed elevated cholesterol, low HDL, high LDL and elevated triglyceride.  She denies any recent illicit drug use.  Urine drug test was not checked during this hospitalization.  So far troponin was negative x2.  Cardiology has been consulted for atypical chest pain.   Past Medical History:  Diagnosis Date  . Diabetes mellitus without complication (HCC)     History reviewed. No pertinent surgical history.   Home Medications:  Prior to Admission medications   Not on File    Inpatient Medications: Scheduled Meds: . calcium  carbonate  2 tablet Oral Daily  . enoxaparin (LOVENOX) injection  40 mg Subcutaneous Q24H  . insulin aspart  0-5 Units Subcutaneous QHS  . insulin aspart  0-9 Units Subcutaneous TID WC  . simvastatin  20 mg Oral q1800   Continuous Infusions: . sodium chloride 100 mL/hr at 10/15/17 1121   PRN Meds: acetaminophen, gi cocktail, ondansetron (ZOFRAN) IV  Allergies:   No Known Allergies  Social History:   Social History   Socioeconomic History  . Marital status: Single    Spouse name: Not on file  . Number of children: Not on file  . Years of education: Not on file  . Highest education level: Not on file  Occupational History  . Not on file  Social Needs  . Financial resource strain: Not on file  . Food insecurity:    Worry: Not on file    Inability: Not on file  . Transportation needs:    Medical: Not on file    Non-medical: Not on file  Tobacco Use  . Smoking status: Never Smoker  . Smokeless tobacco: Never Used  Substance and Sexual Activity  . Alcohol use: Never    Frequency: Never  . Drug use: Never  . Sexual activity: Not on file  Lifestyle  . Physical activity:    Days per week: Not on file    Minutes per session: Not on file  . Stress: Not on file  Relationships  . Social connections:    Talks on phone: Not on file  Gets together: Not on file    Attends religious service: Not on file    Active member of club or organization: Not on file    Attends meetings of clubs or organizations: Not on file    Relationship status: Not on file  . Intimate partner violence:    Fear of current or ex partner: Not on file    Emotionally abused: Not on file    Physically abused: Not on file    Forced sexual activity: Not on file  Other Topics Concern  . Not on file  Social History Narrative  . Not on file    Family History:    Family History  Problem Relation Age of Onset  . Diabetes Mellitus II Mother      ROS:  Please see the history of present illness.    All other ROS reviewed and negative.     Physical Exam/Data:   Vitals:   10/14/17 1545 10/14/17 1651 10/14/17 2053 10/15/17 0449  BP: (!) 127/96 125/89 97/82 131/85  Pulse: (!) 126 (!) 127 (!) 114 (!) 105  Resp: (!) 22 20 18 16   Temp:  100 F (37.8 C) (!) 101.9 F (38.8 C) 98.5 F (36.9 C)  TempSrc:  Oral Oral Oral  SpO2: 97% 99% 97% 100%  Weight:      Height:        Intake/Output Summary (Last 24 hours) at 10/15/2017 1352 Last data filed at 10/15/2017 0900 Gross per 24 hour  Intake 2486.14 ml  Output 0 ml  Net 2486.14 ml   Filed Weights   10/14/17 0402  Weight: 234 lb (106.1 kg)   Body mass index is 38.94 kg/m.  General:  Well nourished, well developed, in no acute distress HEENT: normal Lymph: no adenopathy Neck: no JVD Endocrine:  No thryomegaly Vascular: No carotid bruits; FA pulses 2+ bilaterally without bruits  Cardiac:  normal S1, S2; RRR; no murmur  Lungs:  clear to auscultation bilaterally, no wheezing, rhonchi or rales  Abd: soft, nontender, no hepatomegaly  Ext: no edema Musculoskeletal:  No deformities, BUE and BLE strength normal and equal Skin: warm and dry  Neuro:  CNs 2-12 intact, no focal abnormalities noted Psych:  Normal affect   EKG:  The EKG was personally reviewed and demonstrates: Sinus tachycardia, no significant ST-T wave changes. Telemetry:  Telemetry was personally reviewed and demonstrates: Persistent sinus tachycardia, heart rate 100-110  Relevant CV Studies:  Pending echocardiogram  Laboratory Data:  Chemistry Recent Labs  Lab 10/14/17 1923 10/15/17 0331 10/15/17 0709  NA 132* 136 138  K 3.5 3.7 4.1  CL 105 108 109  CO2 18* 21* 25  GLUCOSE 289* 218* 259*  BUN 17 14 11   CREATININE 0.91 0.84 0.78  CALCIUM 8.1* 7.9* 7.8*  GFRNONAA >60 >60 >60  GFRAA >60 >60 >60  ANIONGAP 9 7 4*    Recent Labs  Lab 10/14/17 0420 10/14/17 1923  PROT 7.2  --   ALBUMIN 3.5 2.9*  AST 40  --   ALT 39  --   ALKPHOS 100  --    BILITOT 0.9  --    Hematology Recent Labs  Lab 10/14/17 0420  WBC 12.6*  RBC 4.67  HGB 13.1  HCT 40.8  MCV 87.4  MCH 28.1  MCHC 32.1  RDW 13.1  PLT 313   Cardiac Enzymes Recent Labs  Lab 10/14/17 1623 10/14/17 1923  TROPONINI <0.03 <0.03   No results for input(s): TROPIPOC in the last 168  hours.  BNP Recent Labs  Lab 10/14/17 1623  BNP 58.0    DDimer  Recent Labs  Lab 10/14/17 1240  DDIMER 0.34    Radiology/Studies:  Dg Chest 2 View  Result Date: 10/15/2017 CLINICAL DATA:  Bilateral upper and lower extremity swelling, breathing difficulty now improving. EXAM: CHEST - 2 VIEW COMPARISON:  None in PACs FINDINGS: The lungs are borderline hyperinflated and clear. The heart and pulmonary vascularity are normal. The mediastinum is normal in width. The trachea is midline. There is no pleural effusion. IMPRESSION: There is no active cardiopulmonary disease. Electronically Signed   By: David  Swazil[place] M.D.   On: 10/15/2017 11:17    Assessment and Plan:   1. Atypical chest pain: Chest discomfort only last seconds at the time, appears to be intermittent.  Cardiac risk factors include obesity, hyperlipidemia and a uncontrolled diabetes.  Given the atypical nature of her symptoms, will wait for the results of echocardiogram, if EF is normal, may consider outpatient plain old treadmill test or even monitoring.   2. Dehydration: BUN to creatinine ratio > 20 on arrival.  Likely in the setting of elevated blood glucose level and dehydration, contributing to persistent tachycardia.  Although it may be beneficial to obtain a urine drug test.  Patient adamantly denies any illicit drug use.  3. Fever: Tmax 101.9  4. Uncontrolled diabetes mellitus: Hemoglobin A1c 12.9, blood glucose over 500 on first arrival.  Has not taken any anti-hyperglycemic medication for the past 2 months due to insurance reason.  5. Hyperlipidemia: HDL 27, LDL 132, triglyceride 380, total cholesterol 235.  Need  weight loss and increased activity.  For questions or updates, please contact [facility] Please consult [email or web address] for contact info under Cardiology/STEMI.   Ramond DialSigned, Alexis Kirby, [place]  10/15/2017 1:52 PM    The patient was seen, examined and discussed with Azalee CourseHao Meng, PA-C and I agree with the above.   37 y.o. female with a hx of uncontrolled DM II and HLD who is being seen today for the evaluation of chest pain after 4 hours of persistent vomiting. This is her first episode of chest pain, it happened after 4 hours of sudden onset diarrhea and vomiting, the pain was atypical, sharp, epigastric radiating to the left shoulder. Now resolved. ECG shows just sinus tachycardia otherwise normal. Echo shows normal systolic and diastolic function, no significant valvular abnormalities. She has never smoked and had no significant FH of premature CAD. Her pain is most probably related to vomiting sec to acute GI illness.  I would not proceed with any further ischemic workup. Labs show significant hyperlipidemia - mixed both LDL and TG - I would start rosuvastatin 20 mg po daily a follow up lipids and LFTs in 1 months.   Tobias AlexanderKatarina Malayasia Mirkin, MD 10/15/2017

## 2017-10-15 NOTE — Progress Notes (Signed)
  Echocardiogram 2D Echocardiogram has been performed.  Alexis Kirby 10/15/2017, 10:08 AM

## 2017-10-15 NOTE — Plan of Care (Signed)
  RD consulted for nutrition education regarding diabetes; pt reports she was diagnosed at the age of 37 but has not received any formal education   Lab Results  Component Value Date   HGBA1C 12.9 (H) 10/14/2017    RD provided "Carbohydrate Counting for People with Diabetes" handout from the Academy of Nutrition and Dietetics. Discussed different food groups and their effects on blood sugar, emphasizing carbohydrate-containing foods. Provided list of carbohydrates and recommended serving sizes of common foods.  Discussed importance of controlled and consistent carbohydrate intake throughout the day. Provided examples of ways to balance meals/snacks and encouraged intake of high-fiber, whole grain complex carbohydrates. Teach back method used.  Pt very receptive. Expect good compliance.  Body mass index is 38.94 kg/m. Pt meets criteria for obesity unspecified based on current BMI.  Current diet order is Heart Healthy/Carb Mod, patient is consuming approximately 75-100% of meals at this time. Labs and medications reviewed. No further nutrition interventions warranted at this time. RD contact information provided. If additional nutrition issues arise, please re-consult RD.  Romelle Starcherate Babacar Haycraft MS, RD, LDN, CNSC [phone] Pager  [phone] Weekend/On-Call Pager

## 2017-10-16 LAB — GLUCOSE, CAPILLARY
Glucose-Capillary: 237 mg/dL — ABNORMAL HIGH (ref 70–99)
Glucose-Capillary: 292 mg/dL — ABNORMAL HIGH (ref 70–99)

## 2017-10-16 LAB — BASIC METABOLIC PANEL
ANION GAP: 7 (ref 5–15)
BUN: 10 mg/dL (ref 6–20)
CHLORIDE: 104 mmol/L (ref 98–111)
CO2: 24 mmol/L (ref 22–32)
Calcium: 8 mg/dL — ABNORMAL LOW (ref 8.9–10.3)
Creatinine, Ser: 0.79 mg/dL (ref 0.44–1.00)
GFR calc Af Amer: >60 mL/min (ref >60)
Glucose, Bld: 259 mg/dL — ABNORMAL HIGH (ref 70–99)
POTASSIUM: 3.8 mmol/L (ref 3.5–5.1)
SODIUM: 135 mmol/L (ref 135–145)

## 2017-10-16 LAB — CBC
HCT: 32.6 % — ABNORMAL LOW (ref 36.0–46.0)
Hemoglobin: 10.2 g/dL — ABNORMAL LOW (ref 12.0–15.0)
MCH: 27.6 pg (ref 26.0–34.0)
MCHC: 31.3 g/dL (ref 30.0–36.0)
MCV: 88.3 fL (ref 78.0–100.0)
Platelets: 228 10*3/uL (ref 150–400)
RBC: 3.69 MIL/uL — ABNORMAL LOW (ref 3.87–5.11)
RDW: 13.4 % (ref 11.5–15.5)
WBC: 5.7 10*3/uL (ref 4.0–10.5)

## 2017-10-16 MED ORDER — "INSULIN SYRINGE-NEEDLE U-100 30G X 5/16"" 0.3 ML MISC": 1.0000 | Freq: Two times a day (BID) | 12 refills | Status: AC

## 2017-10-16 MED ORDER — SHARPS CONTAINER MISC: 1.0000 | 1 refills | Status: AC | PRN

## 2017-10-16 MED ORDER — SIMVASTATIN 20 MG PO TABS: 20.0000 mg | ORAL_TABLET | Freq: Every day | ORAL | 0 refills | Status: AC

## 2017-10-16 MED ORDER — INSULIN ASPART PROT & ASPART (70-30 MIX) 100 UNIT/ML ~~LOC~~ SUSP: 5.0000 [IU] | Freq: Two times a day (BID) | SUBCUTANEOUS | 0 refills | Status: AC

## 2017-10-16 MED ORDER — GLUCOSE BLOOD VI STRP: ORAL_STRIP | 12 refills | Status: AC

## 2017-10-16 MED ORDER — METFORMIN HCL 1000 MG PO TABS: 1000.0000 mg | ORAL_TABLET | Freq: Every day | ORAL | 0 refills | Status: AC

## 2017-10-16 MED ORDER — RELION LANCING DEVICE KIT: 1.0000 | PACK | Freq: Two times a day (BID) | 1 refills | Status: AC

## 2017-10-16 MED ORDER — GUAIFENESIN ER 600 MG PO TB12: 600.0000 mg | ORAL_TABLET | Freq: Two times a day (BID) | ORAL | Status: DC | PRN

## 2017-10-16 MED ORDER — RELION LANCETS ULTRA-THIN 30G MISC: 1.0000 [IU] | Freq: Two times a day (BID) | 12 refills | Status: AC

## 2017-10-16 MED ORDER — RELION CONFIRM GLUCOSE MONITOR W/DEVICE KIT: 1.0000 [IU] | PACK | Freq: Two times a day (BID) | 0 refills | Status: AC

## 2017-10-16 MED ORDER — INSULIN ASPART PROT & ASPART (70-30 MIX) 100 UNIT/ML ~~LOC~~ SUSP
5.0000 [IU] | Freq: Two times a day (BID) | SUBCUTANEOUS | Status: DC
  Filled 2017-10-16: qty 10

## 2017-10-16 NOTE — Progress Notes (Signed)
Discharge instructions (including medications) discussed with and copy provided to patient/caregiver 

## 2017-10-16 NOTE — Progress Notes (Signed)
Patient seen an examined for complaint of Bartholin cyst present x1.5 weeks per patient report. She denies any use of warm compresses and states that it feels improved from a few days ago.  Nurse Olegario MessierKathy present for exam.    Patient has a 1.5 cm erythematous cyst at right posterior vestibule of vagina, not draining. Mildly tender to palpation.    Recommend patient use warm compresses on the affected area every 2 hrs x 1 week and if not improved, to call PCP for examination.  Solmon IceBailey J Rittberger, DO 2:14 PM

## 2017-10-16 NOTE — Progress Notes (Signed)
Family Medicine Teaching Service Daily Progress Note Intern Pager: [phone]  Patient name: Alexis Kirby Medical record number: [id number] Date of birth: 11-03-1980 Age: 37 y.o. Gender: female  Primary Care Provider: Patient, No Pcp Per Consultants: None Code Status: Full code  Pt Overview and Major Events to Date:  7/7 Admitted with hyperglycemia, dehydration, N/V/D  Assessment and Plan:  Alexis Kirby is a 37 y.o. female presenting with hyperglycemia, nausea, vomiting, diarrhea. PMH is significant for T2DM uncontrolled.  T2DM  Hyperglycemia >500, not DKA: Uncontrolled.  Patient does not have insurance.  States that she has been taking a friend's metformin 800mg  QD.  She has been on insulin in the past, but has been without it for many years.  She states that she will occasionally check her sugars.  Last week was in 300s.  She states that she has not been taking metformin for the last few days.  At 10pm on 7/6 patient started to suddenly experience cramping abdominal pain, nausea, vomiting, and diarrhea.  She notes that she was at a party and had eaten two hours prior.  It continued throughout the night into this AM, when she started to also experience left sided chest pain and shortness of breath. Patient has received 5L NS since admission.  Patient has remained afebrile overnight, pulse 100 this AM.  Hgb A1c 12.9, BS 259 this AM.  Given 3 units 70/30 insulin on 7/8.  C-peptide 4.2, Cr 0.79 on 7/9.  Suspect Viral GI component.  Patient states that she feels "great" this AM, still having some diarrhea, but improving.  Denies N/V.  - monitor vitals - sSSI and monitor CBGs AC/HS - Start insulin 70/30 5u BID  - Start Metformin 1000 QD  - Zofran for nausea - not currently on IVF   Chest pain (potentially exertional angina): Patient notes stabbing chest pain off an on for the past few months.  She states that it has worsened with episodes of vomiting.  She notes that it is left sided  and worsens with activity.  Radiates from the stomach when cramping and occasionally "my left arm gets tight."  She denies prior cardiac history or hx HTN but admittedly has a very incomplete hx of medical interaction  She does have some reporducible chest pain but states the Chest pain she was talking about is not the one reproducible to palpation of chest wall. D-dimer in ED was negative.  Patient denies current chest pain.  BNP 58, Lipid Panel, TSH 0.555, LDL 132, TG 380.  TG could be elevated in hyperglycemia. Troponin negative x2, no draw last PM for third.  CXR on 7/8 showed no active cardiopulm disease.  Echo on 7/8 EF 55-60% with normal LV function and no valvular disease. Cardiology does not recommend further ischemic workup.  BP 156/102 R arm this AM, recheck on L arm 125/75.  Repeat of R arm in 20 minutes 137/88. - cardiology consult, appreciate recommendations - Heart score of 2 - incomplete information - Start Simvastatin 20mg  QD, on [employer] [facility] List, f/u lipid panel and LFTs in 1 month   Needs Outpatient management : Patient does not have a PCP and does not have insurance.  She has been without medicaitons for years for this reason.  [place] in [place], [place] and is here visiting [place].  Social work has given patient information on [facility] letter and Medication assistance. - patient to [facility] [phone] to arrange an appointment - has had  insurance in the past, lost since lost job - will consult care management for assistance with Medicaid  History of Bartholin Cyst: has history of bartholin cyst that ruptured, required surgery, and caused patient to become septic.  She states that over the last week or so she has started to feel like she is getting another one.  Today she states that she feels as though her cyst has improved, but starting to drain.  She is requesting exam this afternoon after she can shower. - exam this afternoon  FEN/GI: Carb  modified Prophylaxis: Lovenox    Disposition: home  Subjective:  Patient feeling "great" this AM.  Denies CP, SOB.  Admits to continued diarrhea without blood, continue to decrease in frequency.  Denies N/V.    Objective: Temp:  [98.6 F (37 C)-98.9 F (37.2 C)] 98.7 F (37.1 C) (07/09 0805) Pulse Rate:  [99-122] 99 (07/09 0822) Resp:  [18] 18 (07/09 0805) BP: (125-156)/(75-102) 137/88 (07/09 0822) SpO2:  [98 %-99 %] 99 % (07/09 0822) Weight:  [234 lb 0.1 oz (106.1 kg)] 234 lb 0.1 oz (106.1 kg) (07/08 2049)   Physical Exam: General: 37 yo female lying in bed in NAD Cardiovascular: Mildly tachycardic at 103, no m/r/g Respiratory: CTAB, no wheezes, crackles, rhonchi  Abdomen: Soft, non-tender to palpation, + bowel sounds Extremities: Trace edema BLE  Laboratory: Recent Labs  Lab 10/14/17 0420 10/16/17 0455  WBC 12.6* 5.7  HGB 13.1 10.2*  HCT 40.8 32.6*  PLT 313 228   Recent Labs  Lab 10/14/17 0420  10/15/17 0331 10/15/17 0709 10/16/17 0455  NA 131*   < > 136 138 135  K 4.4   < > 3.7 4.1 3.8  CL 100   < > 108 109 104  CO2 19*   < > 21* 25 24  BUN 30*   < > 14 11 10   CREATININE 1.18*   < > 0.84 0.78 0.79  CALCIUM 8.9   < > 7.9* 7.8* 8.0*  PROT 7.2  --   --   --   --   BILITOT 0.9  --   --   --   --   ALKPHOS 100  --   --   --   --   ALT 39  --   --   --   --   AST 40  --   --   --   --   GLUCOSE 526*   < > 218* 259* 259*   < > = values in this interval not displayed.       Imaging/Diagnostic Tests: No results found.  Rittberger, Solmon Ice, DO 10/16/2017, 1:41 PM PGY-1, [facility] Family Medicine FPTS Intern pager: [phone], text pages welcome

## 2017-10-16 NOTE — Discharge Summary (Signed)
[facility] Discharge Summary  Patient name: Alexis Kirby Medical record number: [id number] Date of birth: 12-Feb-1981 Age: 37 y.o. Gender: female Date of Admission: 10/14/2017  Date of Discharge: 10/16/2017 Admitting Physician: Leeanne Rio, MD  Primary Care Provider: No PCP per Patient Consultants: Cardiology  Indication for Hospitalization: hyperglycemia, dehydration, nausea, vomiting, diarrhea  Discharge Diagnoses/Problem List:  1. T2DM with hyperglycemia >500, not DKA 2. Chest pain 3. Needs outpatient management 4. History of Bartholin Cyst  Disposition: Home   Discharge Condition: Stable  Discharge Exam:  General: 37 yo female lying in bed in NAD Cardiovascular: Mildly tachycardic, no m/r/g Respiratory: CTAB, no wheezes, crackles Abdomen: Soft, non-tender, + bowel sounds Extremities: Trace edema, BLE  Brief Hospital Course:  Alexis Kirby is a 37 y.o.female presenting with hyperglycemia, nausea, vomiting, diarrhea.  PMH significant for T2DM uncontrolled.  T2DMHyperglycemia >500, not DKA: Uncontrolled. Patient does not have insurance. States that she has been taking a friend's metformin 852m QD. She has been on insulin in the past, but has been without it for many years. She states that she will occasionally check her sugars. Last week was in 300s. She states that she has not been taking metformin for the last few days. At 10pm on 7/6 patient started to suddenly experience cramping abdominal pain, nausea, vomiting, and diarrhea. She notes that she was at a party and had eaten two hours prior. It continued throughout the night into the morning on 7.7, when she started to also experience left sided chest pain and shortness of breath. Upon presentation, patient was found to be tachycardic in the 130-140s, with BS in the 500s.  Her Hgb A1c was found to be 12.9.  Patient received 5L NS during her admission and was started on  sliding scale insulin. Throughout her admission, her tachycardia continued to improve and was 100 on the day of discharge.  Blood sugars continues to be adjusted and the patient was discharged on 5u of 70/30 insulin BID and Metformin 1000 QD, keeping in mind the cost and GI side effects of the medications.  Her nausea, vomiting resolved by the time of discharge.  Her diarrhea improved, but she still continued to experience mild symptoms at the time of discharge.    Chest pain(potentially exertional angina): Upon presentation the patient noteed stabbing chest pain off an on for the past few months. She stated that it had worsened with episodes of vomiting, was left sided, and also worsened with activity.She also noted that it radiated from the stomach when cramping and occasionally she felt as though her left arm would get "tight" with the pain. She denied a history of cardiac problems or HTN, but has a very incomplete history. D-dimer in ED was negative. BNP 58, Lipid Panel, TSH 0.555, LDL 132, TG 380.  Troponins were negative.  CXR on 7/8 showed no active cardiopulm disease.  She was evaluated by cardiology and received an Echo, but they did not recommend a further cardiac workup as they believe the chest pain to not be heart related and the Echo was negative.   Echo on 7/8 EF 55-60% with normal LV function and no valvular disease.  She was started on Simvastatin for 244mQD for hyperlipidemia, keeping in mind the cost of the medication for the patient.      Needs Outpatient management: Patient does not have a PCP and does not have insurance. She has been without medicaitons for years for this reason. She lives in  [place], [place] and is here visiting family.  Social Work was consulted during the admission and helped provide the patient with resources for care in [place].  She was given the number for the [facility] and called to arrange an appointment.  She was able to arrange a  follow up appointment for 7/11 at 9:45am.  History of Bartholin Cyst: The patient has history of bartholin cyst that ruptured, required surgery, and caused patient to become septic. She stated that over the last week or so she had started to feel like she was getting another one. Throughout her hospital stay, she felt that it began to improve, but wanted to have it examined prior to discharge.  Upon examination, the cyst was not draining any fluid or pus, was 1.5cm in size with erythema on the cyst, no surrounding erythema, and mildly tender to palpation.  Patient stated that she had not used warm compresses to this point.  She was instructed to use warm compresses q2hrs on the cyst and to follow up with her new PCP if she did not experience any improvement.    Issues for Follow Up:  1. Patient started on Simvastatin 20 mg QD for hyperlipidemia.  Please recheck Lipid Panel and LFT's in 1 month. 2. Follow up on patient's glycemic control.  Recommend repeat A1c in 3 months. 3. Titrate up metformin to BID as GI symptoms allow. 4. No cardiology follow up needed at this time.  Please continue to monitor for chest pain. 5. Follow up on bartholin cyst.  Significant Procedures: None  Significant Labs and Imaging:  Recent Labs  Lab 10/14/17 0420 10/16/17 0455  WBC 12.6* 5.7  HGB 13.1 10.2*  HCT 40.8 32.6*  PLT 313 228   Recent Labs  Lab 10/14/17 0420 10/14/17 1623 10/14/17 1923 10/15/17 0331 10/15/17 0709 10/16/17 0455  NA 131* 135 132* 136 138 135  K 4.4 3.6 3.5 3.7 4.1 3.8  CL 100 107 105 108 109 104  CO2 19* 19* 18* 21* 25 24  GLUCOSE 526* 338* 289* 218* 259* 259*  BUN 30* _0 CREATININE 1.18* 0.93 0.91 0.84 0.78 0.79  CALCIUM 8.9 7.9* 8.1* 7.9* 7.8* 8.0*  ALKPHOS 100  --   --   --   --   --   AST 40  --   --   --   --   --   ALT 39  --   --   --   --   --   ALBUMIN 3.5  --  2.9*  --   --   --     No results found.  Results/Tests Pending at Time of Discharge:  None  Discharge Medications:  Allergies as of 10/16/2017   No Known Allergies     Medication List    TAKE these medications   glucose blood test strip Commonly known as:  RELION GLUCOSE TEST STRIPS Use as instructed   insulin aspart protamine- aspart (70-30) 100 UNIT/ML injection Commonly known as:  NOVOLOG MIX 70/30 Inject 0.05 mLs (5 Units total) into the skin 2 (two) times daily with a meal.   Insulin Syringe-Needle U-100 30G X 5/16" 0.3 ML Misc Commonly known as:  RELION INSULIN SYR 2.6OM/35D 1 applicator by Does not apply route 2 (two) times daily.   metFORMIN 1000 MG tablet Commonly known as:  GLUCOPHAGE Take 1 tablet (1,000 mg total) by mouth daily.   RELION CONFIRM GLUCOSE MONITOR w/Device Kit 1  Units by Does not apply route 2 (two) times daily.   RELION LANCING DEVICE Kit 1 kit by Does not apply route 2 (two) times daily.   ReliOn Ultra Thin Lancets Misc 1 Units by Does not apply route 2 (two) times daily.   sharps container 1 each by Does not apply route as needed.   simvastatin 20 MG tablet Commonly known as:  ZOCOR Take 1 tablet (20 mg total) by mouth daily at 6 PM.       Discharge Instructions: Please refer to Patient Instructions section of EMR for full details.  Patient was counseled important signs and symptoms that should prompt return to medical care, changes in medications, dietary instructions, activity restrictions, and follow up appointments.   Follow-Up Appointments: [facility] Department 10/18/2017 at 9:45 am  Rittberger, Bernita Raisin, DO 10/17/2017, 7:50 PM PGY-1, [place]

## 2018-11-27 IMAGING — DX DG CHEST 2V
2 series · 2 of 2 positions shown · non-contrast
Comparison: None in PACs

CLINICAL DATA: Bilateral upper and lower extremity swelling,
breathing difficulty now improving.

EXAM:
CHEST - 2 VIEW

[chest pa]
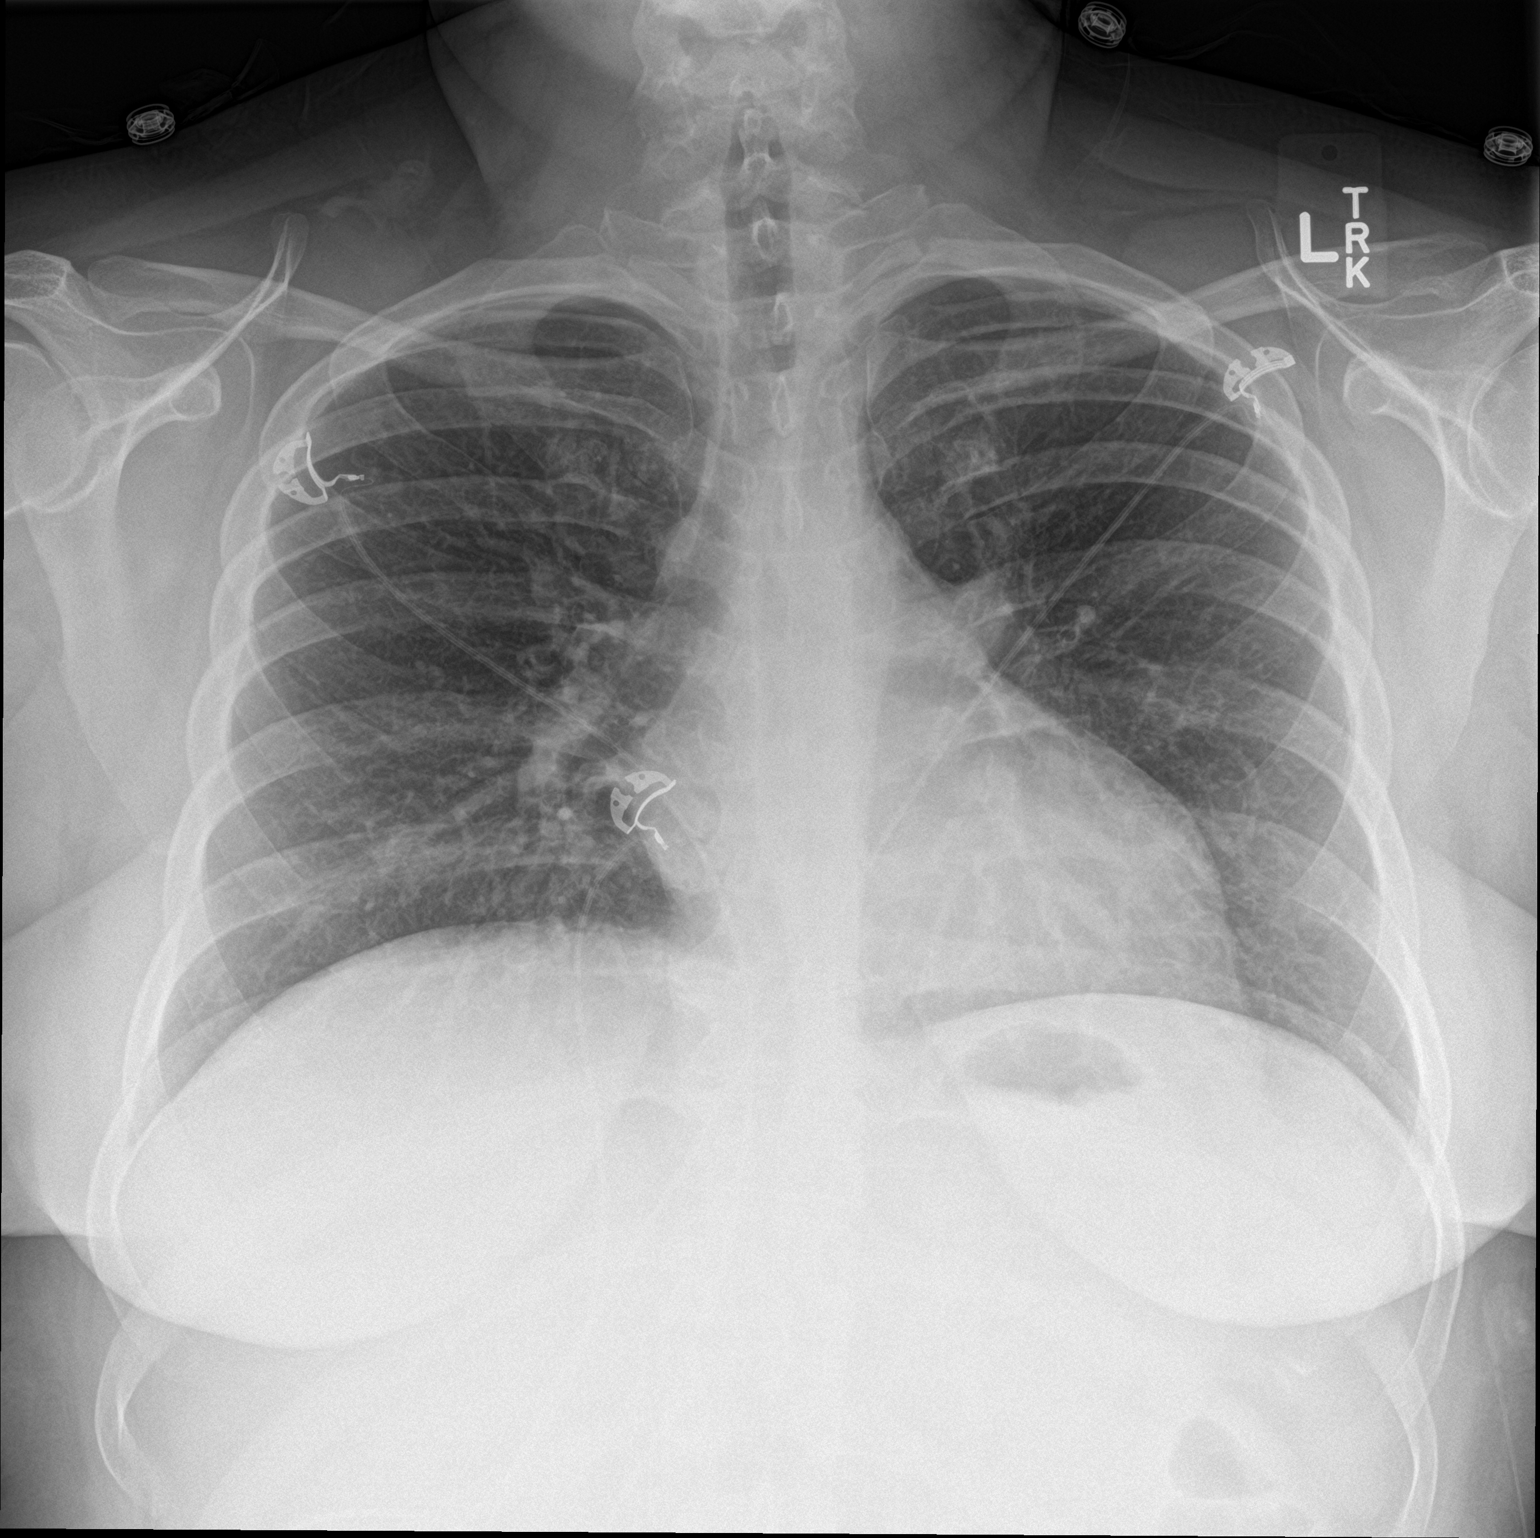

[chest lat]
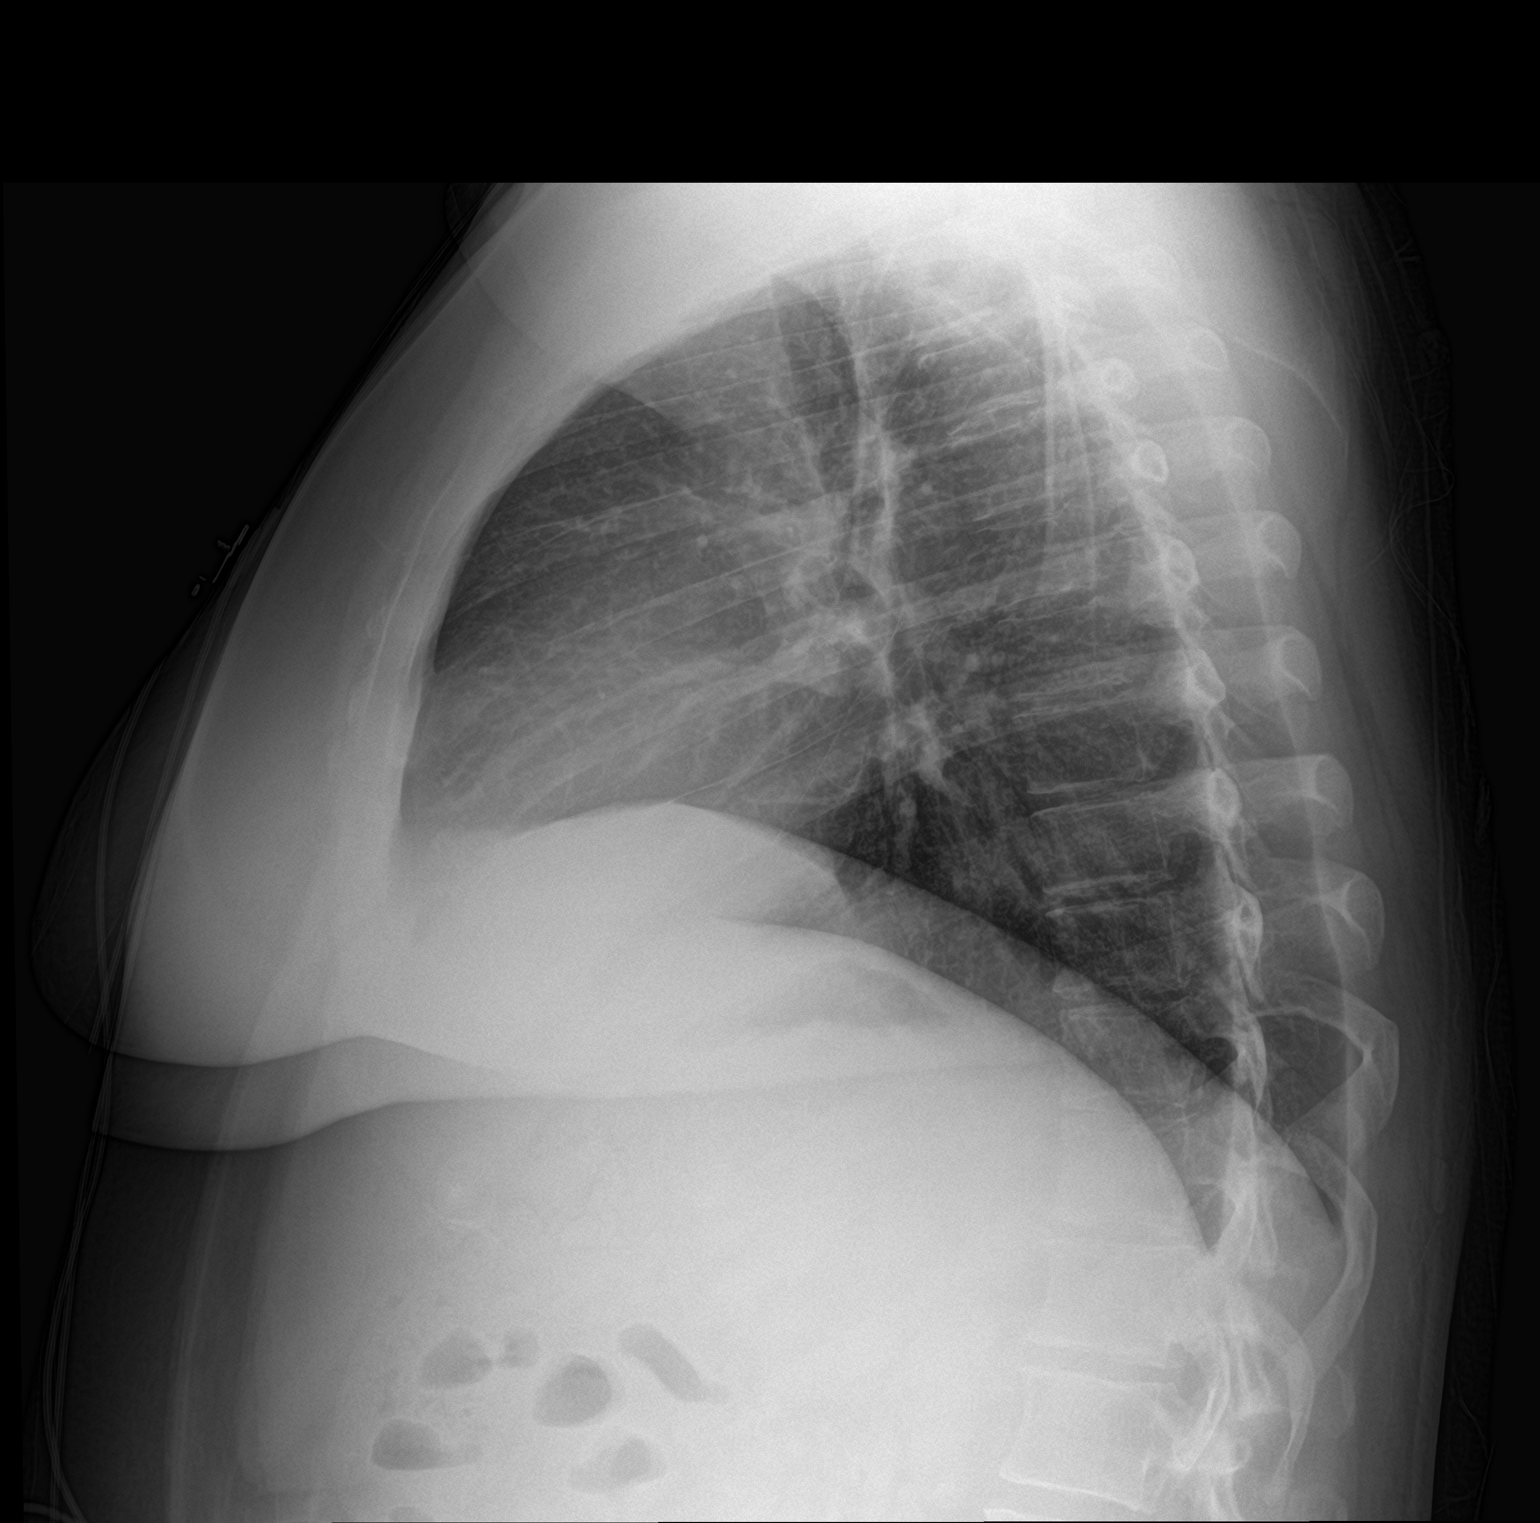

[2 of 2 positions shown; findings below may reference images not displayed]

FINDINGS: The lungs are borderline hyperinflated and clear. The heart and
pulmonary vascularity are normal. The mediastinum is normal in
width. The trachea is midline. There is no pleural effusion.
IMPRESSION: There is no active cardiopulmonary disease.
